# Patient Record
Sex: Male | Born: 1990 | Race: Black or African American | Hispanic: No | Smoking: Current every day smoker
Health system: Southern US, Community
[De-identification: ages and names within clinical notes are randomized; demographics above are authoritative.]

---

## 2013-09-13 ENCOUNTER — Emergency Department: Payer: Self-pay | Admitting: Emergency Medicine
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
## 2015-11-14 ENCOUNTER — Encounter: Payer: Self-pay | Admitting: Emergency Medicine

## 2015-11-14 ENCOUNTER — Emergency Department
Admission: EM | Admit: 2015-11-14 | Discharge: 2015-11-14 | Disposition: A | Discharge status: Home / Self Care | Payer: Self-pay | Attending: Emergency Medicine | Admitting: Emergency Medicine

## 2015-11-14 DIAGNOSIS — R197 Diarrhea, unspecified: Secondary | ICD-10-CM | POA: Insufficient documentation

## 2015-11-14 DIAGNOSIS — R112 Nausea with vomiting, unspecified: Secondary | ICD-10-CM

## 2015-11-14 DIAGNOSIS — R1013 Epigastric pain: Secondary | ICD-10-CM | POA: Insufficient documentation

## 2015-11-14 DIAGNOSIS — F17210 Nicotine dependence, cigarettes, uncomplicated: Secondary | ICD-10-CM | POA: Insufficient documentation

## 2015-11-14 LAB — COMPREHENSIVE METABOLIC PANEL
ALK PHOS: 57 U/L (ref 38–126)
ALT: 23 U/L (ref 17–63)
ANION GAP: 15 (ref 5–15)
AST: 41 U/L (ref 15–41)
Albumin: 5.2 g/dL — ABNORMAL HIGH (ref 3.5–5.0)
BILIRUBIN TOTAL: 2.1 mg/dL — AB (ref 0.3–1.2)
BUN: 14 mg/dL (ref 6–20)
CALCIUM: 10 mg/dL (ref 8.9–10.3)
CO2: 25 mmol/L (ref 22–32)
Chloride: 102 mmol/L (ref 101–111)
Creatinine, Ser: 1.32 mg/dL — ABNORMAL HIGH (ref 0.61–1.24)
GFR calc Af Amer: >60 mL/min (ref >60)
Glucose, Bld: 84 mg/dL (ref 65–99)
POTASSIUM: 3.6 mmol/L (ref 3.5–5.1)
Sodium: 142 mmol/L (ref 135–145)
TOTAL PROTEIN: 8.9 g/dL — AB (ref 6.5–8.1)

## 2015-11-14 LAB — CBC WITH DIFFERENTIAL/PLATELET
Basophils Absolute: 0.1 10*3/uL (ref 0–0.1)
Basophils Relative: 1 %
Eosinophils Absolute: 0 10*3/uL (ref 0–0.7)
Eosinophils Relative: 0 %
HEMATOCRIT: 47.3 % (ref 40.0–52.0)
Hemoglobin: 16.2 g/dL (ref 13.0–18.0)
LYMPHS ABS: 1.6 10*3/uL (ref 1.0–3.6)
LYMPHS PCT: 15 %
MCH: 29.6 pg (ref 26.0–34.0)
MCHC: 34.1 g/dL (ref 32.0–36.0)
MCV: 86.8 fL (ref 80.0–100.0)
MONO ABS: 0.4 10*3/uL (ref 0.2–1.0)
Monocytes Relative: 4 %
NEUTROS ABS: 8.7 10*3/uL — AB (ref 1.4–6.5)
Neutrophils Relative %: 80 %
Platelets: 217 10*3/uL (ref 150–440)
RBC: 5.45 MIL/uL (ref 4.40–5.90)
RDW: 13.4 % (ref 11.5–14.5)
WBC: 10.8 10*3/uL — ABNORMAL HIGH (ref 3.8–10.6)

## 2015-11-14 LAB — LIPASE, BLOOD: LIPASE: 20 U/L (ref 11–51)

## 2015-11-14 LAB — GLUCOSE, CAPILLARY: Glucose-Capillary: 73 mg/dL (ref 65–99)

## 2015-11-14 MED ORDER — SODIUM CHLORIDE 0.9 % IV BOLUS (SEPSIS)
1000.0000 mL | Freq: Once | INTRAVENOUS | Status: AC
  Administered 2015-11-14: 1000 mL via INTRAVENOUS

## 2015-11-14 MED ORDER — ONDANSETRON HCL 4 MG/2ML IJ SOLN
4.0000 mg | Freq: Once | INTRAMUSCULAR | Status: AC
  Administered 2015-11-14: 4 mg via INTRAVENOUS
  Filled 2015-11-14: qty 2

## 2015-11-14 NOTE — ED Notes (Signed)
Pt notified of need for urine sample. 

## 2015-11-14 NOTE — ED Provider Notes (Signed)
[facility] Emergency Department Provider Note   ____________________________________________  Time seen: I have reviewed the triage vital signs and the triage nursing note.  HISTORY  Chief Complaint Emesis   Historian Patient  HPI Steven Levine is a 25 y.o. male who reports nausea vomiting and diarrhea for 2-3 days. He's vomited over 5 times last 24 hours. He's had some mild upper abdominal cramping. No fever. No black or bloody stools. No chest pain or trouble breathing. No significant travel history or known sick contacts. Symptoms are moderate, nothing makes it worse better.   History reviewed. No pertinent past medical history.  There are no active problems to display for this patient.   History reviewed. No pertinent past surgical history.  No current outpatient prescriptions on file.  Allergies Review of patient's allergies indicates no known allergies.  History reviewed. No pertinent family history.  Social History Social History  Substance Use Topics  . Smoking status: Current Every Day Smoker -- 0.50 packs/day    Types: Cigarettes  . Smokeless tobacco: None  . Alcohol Use: None    Review of Systems  Constitutional: Negative for fever. Eyes: Negative for visual changes. ENT: Negative for sore throat. Cardiovascular: Negative for chest pain. Respiratory: Negative for shortness of breath. Gastrointestinal: No black or bloody stools. Genitourinary: Negative for dysuria. Musculoskeletal: Negative for back pain. Skin: Negative for rash. Neurological: Negative for headache. 10 point Review of Systems otherwise negative ____________________________________________   PHYSICAL EXAM:  VITAL SIGNS: ED Triage Vitals  Enc Vitals Group     BP 11/14/15 1200 167/91 mmHg     Pulse Rate 11/14/15 1200 50     Resp 11/14/15 1200 20     Temp 11/14/15 1200 98.4 F (36.9 C)     Temp Source 11/14/15 1200 Oral     SpO2 11/14/15 1200 100 %      Weight 11/14/15 1200 165 lb (74.844 kg)     Height 11/14/15 1200 6\' 1"  (1.854 m)     Head Cir --      Peak Flow --      Pain Score 11/14/15 1202 0     Pain Loc --      Pain Edu? --      Excl. in GC? --      Constitutional: Alert and oriented. Called up in a ball but otherwise in no acute distress.Marland Kitchen. HEENT   Head: Normocephalic and atraumatic.      Eyes: Conjunctivae are normal. PERRL. Normal extraocular movements.      Ears:         Nose: No congestion/rhinnorhea.   Mouth/Throat: Mucous membranes are moist.   Neck: No stridor. Cardiovascular/Chest: Normal rate, regular rhythm.  No murmurs, rubs, or gallops. Respiratory: Normal respiratory effort without tachypnea nor retractions. Breath sounds are clear and equal bilaterally. No wheezes/rales/rhonchi. Gastrointestinal: Soft. No distention, no guarding, no rebound. Mild tenderness in the epigastrium.  Genitourinary/rectal:Deferred Musculoskeletal: Nontender with normal range of motion in all extremities. No joint effusions.  No lower extremity tenderness.  No edema. Neurologic:  Normal speech and language. No gross or focal neurologic deficits are appreciated. Skin:  Skin is warm, dry and intact. No rash noted. Psychiatric: Mood and affect are normal. Speech and behavior are normal. Patient exhibits appropriate insight and judgment.  ____________________________________________   EKG I, Governor Rooksebecca Avree Szczygiel, MD, the attending physician have personally viewed and interpreted all ECGs.  None ____________________________________________  LABS (pertinent positives/negatives)  White blood count 10.8, hemoglobin 16.2 and platelet  count 217 a concomitant metabolic panel significant for creatinine 1.32 otherwise O is within normal limits Lipase 20   ____________________________________________  RADIOLOGY All Xrays were viewed by me. Imaging interpreted by  Radiologist.  None __________________________________________  PROCEDURES  Procedure(s) performed: None  Critical Care performed: None  ____________________________________________   ED COURSE / ASSESSMENT AND PLAN  Pertinent labs & imaging results that were available during my care of the patient were reviewed by me and considered in my medical decision making (see chart for details).    Patient has mild dry lips and history of several days of vomiting with some diarrhea, nonbloody and without fevers without any high risk features.  After 1 L normal saline and Zofran, the patient feels much better and is requesting to go home. He has not yet given a urine sample, but is denying urinary symptoms, fever, or lower abdominal pain. His right is here and he wants to leave now and not provide a urine sample.  Abdomen soft, I don't have a suspicion of an acute surgical emergency.    CONSULTATIONS:   None   Patient / Family / Caregiver informed of clinical course, medical decision-making process, and agree with plan.   I discussed return precautions, follow-up instructions, and discharged instructions with patient and/or family.   ___________________________________________   FINAL CLINICAL IMPRESSION(S) / ED DIAGNOSES   Final diagnoses:  Nausea vomiting and diarrhea              Note: This dictation was prepared with Dragon dictation. Any transcriptional errors that result from this process are unintentional   Governor Rooks, MD 11/14/15 1324

## 2015-11-14 NOTE — ED Notes (Signed)
Pt reports nausea and vomiting for 2 days - Denies any other symptoms - Color wnl for ethnicity - mucous membranes dry

## 2015-11-14 NOTE — ED Notes (Signed)
Pt refused to void prior to requesting to be discharged
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
## 2015-11-14 NOTE — Discharge Instructions (Signed)
You were evaluated for her nausea vomiting and diarrhea, and your exam and evaluation are reassuring in the emergency department.  Continue drinking plenty of fluids. Return to emergency department for any worsening condition including black or bloody stools, fever, worsening abdominal pain, or concern for dehydration.   Diarrhea Diarrhea is watery poop (stool). It can make you feel weak, tired, thirsty, or give you a dry mouth (signs of dehydration). Watery poop is a sign of another problem, most often an infection. It often lasts 2-3 days. It can last longer if it is a sign of something serious. Take care of yourself as told by your doctor. HOME CARE   Drink 1 cup (8 ounces) of fluid each time you have watery poop.  Do not drink the following fluids:  Those that contain simple sugars (fructose, glucose, galactose, lactose, sucrose, maltose).  Sports drinks.  Fruit juices.  Whole milk products.  Sodas.  Drinks with caffeine (coffee, tea, soda) or alcohol.  Oral rehydration solution may be used if the doctor says it is okay. You may make your own solution. Follow this recipe:   - teaspoon table salt.   teaspoon baking soda.   teaspoon salt substitute containing potassium chloride.  1 tablespoons sugar.  1 liter (34 ounces) of water.  Avoid the following foods:  High fiber foods, such as raw fruits and vegetables.  Nuts, seeds, and whole grain breads and cereals.   Those that are sweetened with sugar alcohols (xylitol, sorbitol, mannitol).  Try eating the following foods:  Starchy foods, such as rice, toast, pasta, low-sugar cereal, oatmeal, baked potatoes, crackers, and bagels.  Bananas.  Applesauce.  Eat probiotic-rich foods, such as yogurt and milk products that are fermented.  Wash your hands well after each time you have watery poop.  Only take medicine as told by your doctor.  Take a warm bath to help lessen burning or pain from having watery  poop. GET HELP RIGHT AWAY IF:   You cannot drink fluids without throwing up (vomiting).  You keep throwing up.  You have blood in your poop, or your poop looks black and tarry.  You do not pee (urinate) in 6-8 hours, or there is only a small amount of very dark pee.  You have belly (abdominal) pain that gets worse or stays in the same spot (localizes).  You are weak, dizzy, confused, or light-headed.  You have a very bad headache.  Your watery poop gets worse or does not get better.  You have a fever or lasting symptoms for more than 2-3 days.  You have a fever and your symptoms suddenly get worse. MAKE SURE YOU:   Understand these instructions.  Will watch your condition.  Will get help right away if you are not doing well or get worse.   This information is not intended to replace advice given to you by your health care provider. Make sure you discuss any questions you have with your health care provider.   Document Released: 02/16/2008 Document Revised: 09/20/2014 Document Reviewed: 05/07/2012 Elsevier Interactive Patient Education 2016 [employer]  Nausea and Vomiting Nausea is a sick feeling that often comes before throwing up (vomiting). Vomiting is a reflex where stomach contents come out of your mouth. Vomiting can cause severe loss of body fluids (dehydration). Children and elderly adults can become dehydrated quickly, especially if they also have diarrhea. Nausea and vomiting are symptoms of a condition or disease. It is important to find the cause of your symptoms.  CAUSES   Direct irritation of the stomach lining. This irritation can result from increased acid production (gastroesophageal reflux disease), infection, food poisoning, taking certain medicines (such as nonsteroidal anti-inflammatory drugs), alcohol use, or tobacco use.  Signals from the brain.These signals could be caused by a headache, heat exposure, an inner ear disturbance, increased pressure in  the brain from injury, infection, a tumor, or a concussion, pain, emotional stimulus, or metabolic problems.  An obstruction in the gastrointestinal tract (bowel obstruction).  Illnesses such as diabetes, hepatitis, gallbladder problems, appendicitis, kidney problems, cancer, sepsis, atypical symptoms of a heart attack, or eating disorders.  Medical treatments such as chemotherapy and radiation.  Receiving medicine that makes you sleep (general anesthetic) during surgery. DIAGNOSIS Your caregiver may ask for tests to be done if the problems do not improve after a few days. Tests may also be done if symptoms are severe or if the reason for the nausea and vomiting is not clear. Tests may include:  Urine tests.  Blood tests.  Stool tests.  Cultures (to look for evidence of infection).  X-rays or other imaging studies. Test results can help your caregiver make decisions about treatment or the need for additional tests. TREATMENT You need to stay well hydrated. Drink frequently but in small amounts.You may wish to drink water, sports drinks, clear broth, or eat frozen ice pops or gelatin dessert to help stay hydrated.When you eat, eating slowly may help prevent nausea.There are also some antinausea medicines that may help prevent nausea. HOME CARE INSTRUCTIONS   Take all medicine as directed by your caregiver.  If you do not have an appetite, do not force yourself to eat. However, you must continue to drink fluids.  If you have an appetite, eat a normal diet unless your caregiver tells you differently.  Eat a variety of complex carbohydrates (rice, wheat, potatoes, bread), lean meats, yogurt, fruits, and vegetables.  Avoid high-fat foods because they are more difficult to digest.  Drink enough water and fluids to keep your urine clear or pale yellow.  If you are dehydrated, ask your caregiver for specific rehydration instructions. Signs of dehydration may include:  Severe  thirst.  Dry lips and mouth.  Dizziness.  Dark urine.  Decreasing urine frequency and amount.  Confusion.  Rapid breathing or pulse. SEEK IMMEDIATE MEDICAL CARE IF:   You have blood or brown flecks (like coffee grounds) in your vomit.  You have black or bloody stools.  You have a severe headache or stiff neck.  You are confused.  You have severe abdominal pain.  You have chest pain or trouble breathing.  You do not urinate at least once every 8 hours.  You develop cold or clammy skin.  You continue to vomit for longer than 24 to 48 hours.  You have a fever. MAKE SURE YOU:   Understand these instructions.  Will watch your condition.  Will get help right away if you are not doing well or get worse.   This information is not intended to replace advice given to you by your health care provider. Make sure you discuss any questions you have with your health care provider.   Document Released: 08/30/2005 Document Revised: 11/22/2011 Document Reviewed: 01/27/2011 Elsevier Interactive Patient Education [employer]

## 2022-11-30 ENCOUNTER — Other Ambulatory Visit (HOSPITAL_COMMUNITY): Payer: Self-pay
